# Patient Record
Sex: Male | Born: 1985 | Race: White | Hispanic: No | Marital: Single | Smoking: Current every day smoker
Health system: Southern US, Community
[De-identification: ages and names within clinical notes are randomized; demographics above are authoritative.]

## PROBLEM LIST (undated history)

## (undated) ENCOUNTER — Emergency Department: Discharge status: Absent without leave | Payer: Self-pay

## (undated) DIAGNOSIS — K900 Celiac disease: Secondary | ICD-10-CM

---

## 2010-02-07 ENCOUNTER — Ambulatory Visit: Payer: Self-pay | Admitting: Family Medicine

## 2010-12-17 NOTE — Letter (Signed)
Summary: Out of Work  [facility]  [address]   [place], [place] [address]   Phone: [phone]  Fax: [phone]    February 07, 2010   Employee:  Marin Olp    To Whom It May Concern:   For Medical reasons, please excuse the above named employee from work for the following dates:  Start:   03 February 2010  End:   05 February 2010  If you need additional information, please feel free to contact our office.         Sincerely,    Marvis Moeller DO

## 2010-12-17 NOTE — Assessment & Plan Note (Signed)
Summary: PINK EYE   Vital Signs:  Patient Profile:   25 Years Old Male CC:      left eye pain with erythema and drainage X 3days and sore throat X 1 day Height:     69 inches Weight:      258 pounds O2 Sat:      98 % O2 treatment:    Room Air Temp:     97.0 degrees F oral Pulse rate:   100 / minute BP sitting:   140 / 90  (left arm) Cuff size:   large  Pt. in pain?   yes    Location:   left eye    Type:       heaviness  Vitals Entered By: Lajean Saver RN (February 07, 2010 8:39 AM)                   Updated Prior Medication List: No Medications Current Allergies: ! * POLLENHistory of Present Illness Chief Complaint: left eye pain with erythema and drainage X 3days and sore throat X 1 day History of Present Illness: PATIENT STATES HIS SON  WAS HERE LAST WK WITH [facility] AND NOW FOR THE PAST 3 DAYS HE HAS HAD REDNESS TO HIS LEFT EYE WITH DISCHARGE AND HIS RIGHT EYE IS STARTING TO ITCH. SOME RUNNY NOSE. DEVELOPED A SORE THROAT YESTERDAY, NO FEVER, NO COUGH. NP TX PTA.  REVIEW OF SYSTEMS Constitutional Symptoms      Denies fever, chills, night sweats, weight loss, weight gain, and fatigue.  Eyes       Complains of eye pain and eye drainage.      Denies change in vision, glasses, contact lenses, and eye surgery.      Comments: left eye Ear/Nose/Throat/Mouth       Complains of sore throat.      Denies hearing loss/aids, change in hearing, ear pain, ear discharge, dizziness, frequent runny nose, frequent nose bleeds, sinus problems, hoarseness, and tooth pain or bleeding.  Respiratory       Denies dry cough, productive cough, wheezing, shortness of breath, asthma, bronchitis, and emphysema/COPD.  Cardiovascular       Denies murmurs, chest pain, and tires easily with exhertion.    Gastrointestinal       Denies stomach pain, nausea/vomiting, diarrhea, constipation, blood in bowel movements, and indigestion. Genitourniary       Denies painful urination, kidney stones, and loss of  urinary control. Neurological       Denies paralysis, seizures, and fainting/blackouts. Musculoskeletal       Denies muscle pain, joint pain, joint stiffness, decreased range of motion, redness, swelling, muscle weakness, and gout.  Skin       Denies bruising, unusual mles/lumps or sores, and hair/skin or nail changes.  Psych       Denies mood changes, temper/anger issues, anxiety/stress, speech problems, depression, and sleep problems. Other Comments: Patients son was diagnosed with pink eye last week   Past History:  Past Medical History: Unremarkable  Past Surgical History: Denies surgical history  Family History: Mother- Family History High cholesterol Father- healthy Family History Hypertension  Social History: Occupation: [employer] Single Quit smoking 3years ago, smoked X 10 years Alcohol use-yes 2/week Drug use-no Regular exercise-no Drug Use:  no Does Patient Exercise:  no Physical Exam Head: normocephalic, atraumatic Eyes: CONJUNCTIVA INJECTED BILAT L>R. THICK DISCHARGE AND CRUSTING NOTE TO LEFT EYE. PEARLA EOMI Nasal: CONGESTED Oral/Pharynx: THROAT RED AND SWOLLEN WITHOUT EXUDATES Neck: neck supple,  trachea midline,  no masses Chest/Lungs: no rales, wheezes, or rhonchi bilateral, breath sounds equal without effort Heart: regular rate and  rhythm, no murmur Skin: no obvious rashes or lesions Assessment New Problems: PHARYNGITIS, ACUTE (ICD-462.0) CONJUNCTIVITIS (ICD-372.30)   Plan New Medications/Changes: GENTAMICIN OPTH GTTS 2 GTTS three times a day FOR 5-7 DAYS.  #1 BOTTLE x 0, 02/07/2010, Marvis Moeller DO AMOXICILLIN 500 MG CAPS (AMOXICILLIN) 1 by mouth TID  #30 x 0, 02/07/2010, Marvis Moeller DO  New Orders: New Patient Level III [99203]   Prescriptions: GENTAMICIN OPTH GTTS 2 GTTS three times a day FOR 5-7 DAYS.  #1 BOTTLE x 0   Entered and Authorized by:   Marvis Moeller DO   Signed by:   Marvis Moeller DO on 02/07/2010   Method used:    Print then Give to Patient   RxID:   [id number] AMOXICILLIN 500 MG CAPS (AMOXICILLIN) 1 by mouth TID  #30 x 0   Entered and Authorized by:   Marvis Moeller DO   Signed by:   Marvis Moeller DO on 02/07/2010   Method used:   Print then Give to Patient   RxID:   [id number]   Patient Instructions: 1)  COOL COMPRESSES. AVOID CAFFEINE AND MILK PRODUCTS. FOLLOW UP WITH YOUR PCP OR RETURN IF SYMPTOMS PERSIST OR WORSEN.

## 2019-01-25 ENCOUNTER — Other Ambulatory Visit: Payer: Self-pay

## 2019-01-25 ENCOUNTER — Emergency Department (INDEPENDENT_AMBULATORY_CARE_PROVIDER_SITE_OTHER)
Admission: EM | Admit: 2019-01-25 | Discharge: 2019-01-25 | Disposition: A | Discharge status: Home / Self Care | Payer: BLUE CROSS/BLUE SHIELD | Source: Home / Self Care | Attending: Family Medicine | Admitting: Family Medicine

## 2019-01-25 ENCOUNTER — Encounter: Payer: Self-pay | Admitting: Emergency Medicine

## 2019-01-25 DIAGNOSIS — J069 Acute upper respiratory infection, unspecified: Secondary | ICD-10-CM | POA: Diagnosis not present

## 2019-01-25 DIAGNOSIS — B9789 Other viral agents as the cause of diseases classified elsewhere: Secondary | ICD-10-CM | POA: Diagnosis not present

## 2019-01-25 HISTORY — DX: Celiac disease: K90.0

## 2019-01-25 LAB — POCT INFLUENZA A/B
Influenza A, POC: NEGATIVE
Influenza B, POC: NEGATIVE

## 2019-01-25 MED ORDER — GUAIFENESIN-CODEINE 100-10 MG/5ML PO SOLN: ORAL | 0 refills | Status: DC

## 2019-01-25 MED ORDER — DOXYCYCLINE HYCLATE 100 MG PO CAPS: 100.0000 mg | ORAL_CAPSULE | Freq: Two times a day (BID) | ORAL | 0 refills | Status: DC

## 2019-01-25 NOTE — Discharge Instructions (Addendum)
Take plain guaifenesin (1200mg  extended release tabs such as Mucinex) twice daily, with plenty of water, for cough and congestion. Get adequate rest.   May use Afrin nasal spray (or generic oxymetazoline) each morning for about 5 days and then discontinue.  Also recommend using saline nasal spray several times daily and saline nasal irrigation (AYR is a common brand).  Use Flonase nasal spray each morning after using Afrin nasal spray and saline nasal irrigation. Try warm salt water gargles for sore throat.  Stop all antihistamines for now, and other non-prescription cough/cold preparations. May take Ibuprofen 200mg , 4 tabs every 8 hours with food for body aches, headache, etc. Begin Doxycycline if not improving about one week or if persistent fever develops

## 2019-01-25 NOTE — ED Triage Notes (Signed)
Productive cough, runny nose x 3 days, exposure to flu.

## 2019-01-25 NOTE — ED Provider Notes (Signed)
[facility]    CSN: [id number] Arrival date & time: 01/25/19  0818     History   Chief Complaint Chief Complaint  Patient presents with  . Cough    HPI Roger Gregory is a 33 y.o. male.   Patient complains of three day history of typical cold-like symptoms developing over several days, including mild sore throat, sinus congestion, headache, fatigue, and dry cough.  He is concerned that he may have the flu because a co-worker tested positive for influenza B. Patient smokes.  The history is provided by the patient.    Past Medical History:  Diagnosis Date  . Celiac disease     There are no active problems to display for this patient.   History reviewed. No pertinent surgical history.     Home Medications    Prior to Admission medications   Medication Sig Start Date End Date Taking? Authorizing Provider  cholecalciferol (VITAMIN D3) 25 MCG (1000 UT) tablet Take 1,000 Units by mouth daily.   Yes [provider]  lisinopril (PRINIVIL,ZESTRIL) 10 MG tablet Take 10 mg by mouth daily.   Yes [provider]  doxycycline (VIBRAMYCIN) 100 MG capsule Take 1 capsule (100 mg total) by mouth 2 (two) times daily. Take with food (Rx void after 02/02/19) 01/25/19   Lattie Haw, MD  guaiFENesin-codeine 100-10 MG/5ML syrup Take 8mL by mouth at bedtime as needed for cough.  May repeat dose in 4 to 6 hours. 01/25/19   Lattie Haw, MD    Family History Family History  Problem Relation Age of Onset  . Hyperlipidemia Mother   . Hypertension Mother     Social History Social History   Tobacco Use  . Smoking status: Current Every Day Smoker  . Smokeless tobacco: Never Used  Substance Use Topics  . Alcohol use: Yes  . Drug use: Yes     Allergies   Patient has no known allergies.   Review of Systems Review of Systems + sore throat + cough No pleuritic pain No wheezing + nasal congestion + post-nasal drainage No sinus  pain/pressure No itchy/red eyes No earache No hemoptysis No SOB No fever/chills No nausea No vomiting No abdominal pain No diarrhea No urinary symptoms No skin rash + fatigue No myalgias + headache    Physical Exam Triage Vital Signs ED Triage Vitals  Enc Vitals Group     BP 01/25/19 0843 139/80     Pulse Rate 01/25/19 0843 86     Resp --      Temp 01/25/19 0843 98.4 F (36.9 C)     Temp Source 01/25/19 0843 Oral     SpO2 01/25/19 0843 98 %     Weight 01/25/19 0844 250 lb (113.4 kg)     Height 01/25/19 0844 5\' 8"  (1.727 m)     Head Circumference --      Peak Flow --      Pain Score 01/25/19 0844 0     Pain Loc --      Pain Edu? --      Excl. in GC? --    No data found.  Updated Vital Signs BP 139/80 (BP Location: Right Arm)   Pulse 86   Temp 98.4 F (36.9 C) (Oral)   Ht 5\' 8"  (1.727 m)   Wt 113.4 kg   SpO2 98%   BMI 38.01 kg/m   Visual Acuity Right Eye Distance:   Left Eye Distance:   Bilateral Distance:  Right Eye Near:   Left Eye Near:    Bilateral Near:     Physical Exam Nursing notes and Vital Signs reviewed. Appearance:  Patient appears stated age, and in no acute distress Eyes:  Pupils are equal, round, and reactive to light and accomodation.  Extraocular movement is intact.  Conjunctivae are not inflamed  Ears:  Canals normal.  Tympanic membranes normal.  Nose:  Congested turbinates.  No sinus tenderness.  Pharynx:  Normal Neck:  Supple.  Enlarged posterior/lateral nodes are palpated bilaterally, tender to palpation on the left.   Lungs:  Clear to auscultation.  Breath sounds are equal.  Moving air well. Heart:  Regular rate and rhythm without murmurs, rubs, or gallops.  Abdomen:  Nontender without masses or hepatosplenomegaly.  Bowel sounds are present.  No CVA or flank tenderness.  Extremities:  No edema.  Skin:  No rash present.    UC Treatments / Results  Labs (all labs ordered are listed, but only abnormal results are  displayed) Labs Reviewed  POCT INFLUENZA A/B negative    EKG None  Radiology No results found.  Procedures Procedures (including critical care time)  Medications Ordered in UC Medications - No data to display  Initial Impression / Assessment and Plan / UC Course  I have reviewed the triage vital signs and the nursing notes.  Pertinent labs & imaging results that were available during my care of the patient were reviewed by me and considered in my medical decision making (see chart for details).    There is no evidence of bacterial infection today.  Treat symptomatically for now  Rx for Robitussin AC for night time cough.  Controlled Substance Prescriptions I have consulted the [place] Controlled Substances Registry for this patient, and feel the risk/benefit ratio today is favorable for proceeding with this prescription for a controlled substance.   Followup with Family Doctor if not improved In about 10 days.   Final Clinical Impressions(s) / UC Diagnoses   Final diagnoses:  Viral URI with cough     Discharge Instructions     Take plain guaifenesin (1200mg  extended release tabs such as Mucinex) twice daily, with plenty of water, for cough and congestion. Get adequate rest.   May use Afrin nasal spray (or generic oxymetazoline) each morning for about 5 days and then discontinue.  Also recommend using saline nasal spray several times daily and saline nasal irrigation (AYR is a common brand).  Use Flonase nasal spray each morning after using Afrin nasal spray and saline nasal irrigation. Try warm salt water gargles for sore throat.  Stop all antihistamines for now, and other non-prescription cough/cold preparations. May take Ibuprofen 200mg , 4 tabs every 8 hours with food for body aches, headache, etc. Begin Doxycycline if not improving about one week or if persistent fever develops (Given a prescription to hold, with an expiration date)       ED Prescriptions    Medication  Sig Dispense Auth. Provider   guaiFENesin-codeine 100-10 MG/5ML syrup Take 18mL by mouth at bedtime as needed for cough.  May repeat dose in 4 to 6 hours. 100 mL Lattie Haw, MD   doxycycline (VIBRAMYCIN) 100 MG capsule Take 1 capsule (100 mg total) by mouth 2 (two) times daily. Take with food (Rx void after 02/02/19) 14 capsule Lattie Haw, MD         Lattie Haw, MD 01/25/19 1004

## 2020-02-06 ENCOUNTER — Emergency Department
Admission: EM | Admit: 2020-02-06 | Discharge: 2020-02-06 | Disposition: A | Discharge status: Home / Self Care | Payer: 59 | Source: Home / Self Care | Attending: Family Medicine | Admitting: Family Medicine

## 2020-02-06 ENCOUNTER — Other Ambulatory Visit: Payer: Self-pay

## 2020-02-06 DIAGNOSIS — J029 Acute pharyngitis, unspecified: Secondary | ICD-10-CM | POA: Diagnosis not present

## 2020-02-06 DIAGNOSIS — K122 Cellulitis and abscess of mouth: Secondary | ICD-10-CM

## 2020-02-06 DIAGNOSIS — J020 Streptococcal pharyngitis: Secondary | ICD-10-CM

## 2020-02-06 LAB — POCT RAPID STREP A (OFFICE): Rapid Strep A Screen: NEGATIVE

## 2020-02-06 MED ORDER — AMOXICILLIN 875 MG PO TABS: 875.0000 mg | ORAL_TABLET | Freq: Two times a day (BID) | ORAL | 0 refills | Status: DC

## 2020-02-06 MED ORDER — PREDNISONE 20 MG PO TABS: ORAL_TABLET | ORAL | 0 refills | Status: DC

## 2020-02-06 NOTE — ED Provider Notes (Signed)
[facility]    CSN: [id number] Arrival date & time: 02/06/20  0803      History   Chief Complaint Chief Complaint  Patient presents with  . Sore Throat    HPI Roger Gregory is a 34 y.o. male.   Patient complains of onset of a sore throat yesterday morning that has persisted today.  He states that his uvula was swollen yesterday but somewhat better today.  He denies difficulty swallowing.  He admits that he developed nasal congestion about 3 to 4 days ago.  He states that he has allergic rhinitis in the fall but not springtime.  He has felt mildly fatigued, but denies cough and fevers, chills, and sweats.   He denies chest tightness, shortness of breath, and changes in taste/smell.  He states that he briefly had sharp pain in his left ear.   The history is provided by the patient.    Past Medical History:  Diagnosis Date  . Celiac disease     There are no problems to display for this patient.   History reviewed. No pertinent surgical history.     Home Medications    Prior to Admission medications   Medication Sig Start Date End Date Taking? Authorizing Provider  lisinopril (PRINIVIL,ZESTRIL) 10 MG tablet Take 10 mg by mouth daily.   Yes [provider]  amoxicillin (AMOXIL) 875 MG tablet Take 1 tablet (875 mg total) by mouth 2 (two) times daily. 02/06/20   Lattie Haw, MD  cholecalciferol (VITAMIN D3) 25 MCG (1000 UT) tablet Take 1,000 Units by mouth daily.    [provider]  predniSONE (DELTASONE) 20 MG tablet Take one tab by mouth twice daily for 4 days, then one daily for 3 days. Take with food. 02/06/20   Lattie Haw, MD    Family History Family History  Problem Relation Age of Onset  . Hyperlipidemia Mother   . Hypertension Mother     Social History Social History   Tobacco Use  . Smoking status: Current Every Day Smoker    Packs/day: 0.50  . Smokeless tobacco: Never Used  Substance Use Topics  . Alcohol use:  Yes    Comment: socially  . Drug use: Yes     Allergies   Patient has no known allergies.   Review of Systems Review of Systems + sore throat No cough No pleuritic pain No wheezing + nasal congestion ? post-nasal drainage No sinus pain/pressure No itchy/red eyes ? left earache No hemoptysis No SOB No fever/chills No nausea No vomiting No abdominal pain No diarrhea No urinary symptoms No skin rash + fatigue No myalgias + headache    Physical Exam Triage Vital Signs ED Triage Vitals  Enc Vitals Group     BP 02/06/20 0816 127/84     Pulse Rate 02/06/20 0816 95     Resp 02/06/20 0816 18     Temp 02/06/20 0816 98.4 F (36.9 C)     Temp Source 02/06/20 0816 Oral     SpO2 02/06/20 0816 100 %     Weight --      Height --      Head Circumference --      Peak Flow --      Pain Score 02/06/20 0814 4     Pain Loc --      Pain Edu? --      Excl. in GC? --    No data found.  Updated Vital Signs BP 127/84 (  BP Location: Left Arm)   Pulse 95   Temp 98.4 F (36.9 C) (Oral)   Resp 18   SpO2 100%   Visual Acuity Right Eye Distance:   Left Eye Distance:   Bilateral Distance:    Right Eye Near:   Left Eye Near:    Bilateral Near:     Physical Exam Nursing notes and Vital Signs reviewed. Appearance:  Patient appears stated age, and in no acute distress Eyes:  Pupils are equal, round, and reactive to light and accomodation.  Extraocular movement is intact.  Conjunctivae are not inflamed  Ears:  Canals normal.  Right tympanic membrane normal.  Left tympanic membrane has some erythema superiorly but otherwise appears normal. Nose:  Congested turbinates.  No sinus tenderness.  Pharynx:  Erythematous, mildly swollen uvula.  No exudate or obstruction Neck:  Supple.  No adenopathy.  Lungs:  Clear to auscultation.  Breath sounds are equal.  Moving air well. Heart:  Regular rate and rhythm without murmurs, rubs, or gallops.  Abdomen:  Nontender without masses or  hepatosplenomegaly.  Bowel sounds are present.  No CVA or flank tenderness.  Extremities:  No edema.  Skin:  No rash present.   UC Treatments / Results  Labs (all labs ordered are listed, but only abnormal results are displayed) Labs Reviewed  STREP A DNA PROBE  POCT RAPID STREP A (OFFICE) NEGATIVE  Tympanometry:  Right ear tympanogram is wide; Left ear tympanogram normal  EKG   Radiology No results found.  Procedures Procedures (including critical care time)  Medications Ordered in UC Medications - No data to display  Initial Impression / Assessment and Plan / UC Course  I have reviewed the triage vital signs and the nursing notes.  Pertinent labs & imaging results that were available during my care of the patient were reviewed by me and considered in my medical decision making (see chart for details).    Strep DNA probe pending.  No evidence otitis media. Suspect early viral URI.  However, because uvula is significantly erythematous, will begin amoxicillin and prednisone.  If throat culture negative, advised patient that he may discontinue amoxicillin when he feels well. Followup with Family Doctor if not improved in about 10 days.   Final Clinical Impressions(s) / UC Diagnoses   Final diagnoses:  Uvulitis  Acute pharyngitis, unspecified etiology     Discharge Instructions     Try warm salt water gargles for sore throat.   If cold-like symptoms develop, try the following: Take plain guaifenesin (1200mg  extended release tabs such as Mucinex) twice daily, with plenty of water, for cough and congestion.  Get adequate rest.   May use Afrin nasal spray (or generic oxymetazoline) each morning for about 5 days and then discontinue.  Also recommend using saline nasal spray several times daily and saline nasal irrigation (AYR is a common brand).  Use Flonase nasal spray each morning after using Afrin nasal spray and saline nasal irrigation. May take Delsym Cough Suppressant  at bedtime for nighttime cough.  Stop all antihistamines for now, and other non-prescription cough/cold preparations.       ED Prescriptions    Medication Sig Dispense Auth. Provider   amoxicillin (AMOXIL) 875 MG tablet Take 1 tablet (875 mg total) by mouth 2 (two) times daily. 20 tablet Kandra Nicolas, MD   predniSONE (DELTASONE) 20 MG tablet Take one tab by mouth twice daily for 4 days, then one daily for 3 days. Take with food. 11 tablet Shanasia Ibrahim,  Tera Mater, MD        Lattie Haw, MD 02/06/20 [phone]

## 2020-02-06 NOTE — ED Triage Notes (Signed)
Patient presents to Urgent Care with complaints of sore throat since yesterday morning. Patient reports his uvula was swollen yesterday but better today, painful to swallow but pt denies difficulty, pt endorses nasal congestion as well.

## 2020-02-06 NOTE — Discharge Instructions (Addendum)
Try warm salt water gargles for sore throat.   If cold-like symptoms develop, try the following: Take plain guaifenesin (1200mg  extended release tabs such as Mucinex) twice daily, with plenty of water, for cough and congestion.  Get adequate rest.   May use Afrin nasal spray (or generic oxymetazoline) each morning for about 5 days and then discontinue.  Also recommend using saline nasal spray several times daily and saline nasal irrigation (AYR is a common brand).  Use Flonase nasal spray each morning after using Afrin nasal spray and saline nasal irrigation. May take Delsym Cough Suppressant at bedtime for nighttime cough.  Stop all antihistamines for now, and other non-prescription cough/cold preparations.

## 2020-02-07 LAB — STREP A DNA PROBE: Group A Strep Probe: NOT DETECTED

## 2020-02-08 ENCOUNTER — Telehealth: Payer: Self-pay | Admitting: Emergency Medicine

## 2020-10-24 ENCOUNTER — Emergency Department (HOSPITAL_BASED_OUTPATIENT_CLINIC_OR_DEPARTMENT_OTHER)
Admission: EM | Admit: 2020-10-24 | Discharge: 2020-10-24 | Disposition: A | Discharge status: Home / Self Care | Payer: No Typology Code available for payment source | Attending: Emergency Medicine | Admitting: Emergency Medicine

## 2020-10-24 ENCOUNTER — Emergency Department (HOSPITAL_BASED_OUTPATIENT_CLINIC_OR_DEPARTMENT_OTHER): Payer: No Typology Code available for payment source

## 2020-10-24 ENCOUNTER — Encounter (HOSPITAL_BASED_OUTPATIENT_CLINIC_OR_DEPARTMENT_OTHER): Payer: Self-pay | Admitting: *Deleted

## 2020-10-24 ENCOUNTER — Other Ambulatory Visit: Payer: Self-pay

## 2020-10-24 DIAGNOSIS — Z23 Encounter for immunization: Secondary | ICD-10-CM | POA: Diagnosis not present

## 2020-10-24 DIAGNOSIS — W19XXXA Unspecified fall, initial encounter: Secondary | ICD-10-CM

## 2020-10-24 DIAGNOSIS — Y92410 Unspecified street and highway as the place of occurrence of the external cause: Secondary | ICD-10-CM | POA: Diagnosis not present

## 2020-10-24 DIAGNOSIS — F17200 Nicotine dependence, unspecified, uncomplicated: Secondary | ICD-10-CM | POA: Insufficient documentation

## 2020-10-24 DIAGNOSIS — R0781 Pleurodynia: Secondary | ICD-10-CM | POA: Diagnosis not present

## 2020-10-24 DIAGNOSIS — S51011A Laceration without foreign body of right elbow, initial encounter: Secondary | ICD-10-CM | POA: Insufficient documentation

## 2020-10-24 DIAGNOSIS — S59901A Unspecified injury of right elbow, initial encounter: Secondary | ICD-10-CM | POA: Diagnosis present

## 2020-10-24 DIAGNOSIS — M25511 Pain in right shoulder: Secondary | ICD-10-CM | POA: Insufficient documentation

## 2020-10-24 DIAGNOSIS — M25521 Pain in right elbow: Secondary | ICD-10-CM

## 2020-10-24 MED ORDER — TETANUS-DIPHTH-ACELL PERTUSSIS 5-2.5-18.5 LF-MCG/0.5 IM SUSY
0.5000 mL | PREFILLED_SYRINGE | Freq: Once | INTRAMUSCULAR | Status: AC
  Administered 2020-10-24: 0.5 mL via INTRAMUSCULAR
  Filled 2020-10-24: qty 0.5

## 2020-10-24 MED ORDER — METHOCARBAMOL 500 MG PO TABS: 500.0000 mg | ORAL_TABLET | Freq: Every evening | ORAL | 0 refills | Status: DC | PRN

## 2020-10-24 MED ORDER — ACETAMINOPHEN 500 MG PO TABS
1000.0000 mg | ORAL_TABLET | Freq: Once | ORAL | Status: AC
  Administered 2020-10-24: 1000 mg via ORAL
  Filled 2020-10-24: qty 2

## 2020-10-24 NOTE — ED Provider Notes (Signed)
[facility] EMERGENCY DEPARTMENT Provider Note   CSN: [id number] Arrival date & time: 10/24/20  1428     History Chief Complaint  Patient presents with  . Fall    Roger Gregory is a 34 y.o. male history of hypertension lipidemia.  Patient presents after suffering a fall.  Patient reports that he fell at 1345, states he fell out of a tractor trailer and estimates the fall at approximately 5 feet.  Patient reports that he did not hit his head had no loss of consciousness, and was ambulatory after the fall.  Denies any nausea, vomiting, headache, neck or back pain, lightheadedness, dizziness, visual disturbance, numbness or tingling in extremities, or bowel or bladder dysfunction..  Patient complains of pain to his right trapezius, right shoulder, right elbow, and right ribs.  Patient reports at present his pain is a 5 out of 10 on the pain scale.  Patient reports this is an improvement in his pain since he originally fell.  Patient reports increased pain with movement.  Patient denies any blood thinner use.  HPI     Past Medical History:  Diagnosis Date  . Celiac disease     There are no problems to display for this patient.   History reviewed. No pertinent surgical history.     Family History  Problem Relation Age of Onset  . Hyperlipidemia Mother   . Hypertension Mother     Social History   Tobacco Use  . Smoking status: Current Every Day Smoker    Packs/day: 0.50  . Smokeless tobacco: Never Used  Vaping Use  . Vaping Use: Never used  Substance Use Topics  . Alcohol use: Yes    Comment: socially  . Drug use: Yes    Home Medications Prior to Admission medications   Medication Sig Start Date End Date Taking? Authorizing Provider  amoxicillin (AMOXIL) 875 MG tablet Take 1 tablet (875 mg total) by mouth 2 (two) times daily. 02/06/20   Lattie Haw, MD  cholecalciferol (VITAMIN D3) 25 MCG (1000 UT) tablet Take 1,000 Units by mouth daily.    [provider]  lisinopril (PRINIVIL,ZESTRIL) 10 MG tablet Take 10 mg by mouth daily.    [provider]  methocarbamol (ROBAXIN) 500 MG tablet Take 1 tablet (500 mg total) by mouth at bedtime as needed for muscle spasms. 10/24/20   Haskel Schroeder, PA-C  predniSONE (DELTASONE) 20 MG tablet Take one tab by mouth twice daily for 4 days, then one daily for 3 days. Take with food. 02/06/20   Lattie Haw, MD    Allergies    Patient has no known allergies.  Review of Systems   Review of Systems  Physical Exam Updated Vital Signs BP 136/85 (BP Location: Right Arm)   Pulse 72   Temp 98.4 F (36.9 C) (Oral)   Resp 16   Ht 5\' 9"  (1.753 m)   Wt 117.9 kg   SpO2 99%   BMI 38.40 kg/m   Physical Exam Constitutional:      General: He is not in acute distress.    Appearance: He is obese. He is not ill-appearing, toxic-appearing or diaphoretic.  HENT:     Head: Normocephalic and atraumatic.  Eyes:     Pupils: Pupils are equal, round, and reactive to light.  Cardiovascular:     Rate and Rhythm: Normal rate and regular rhythm.     Heart sounds: Normal heart sounds.  Pulmonary:     Effort: Pulmonary  effort is normal.     Breath sounds: Normal breath sounds.  Abdominal:     Palpations: Abdomen is soft.     Tenderness: There is no abdominal tenderness.  Musculoskeletal:     Right shoulder: Tenderness present. No swelling, deformity, bony tenderness or crepitus. Normal range of motion. Normal strength.     Left shoulder: No deformity, tenderness, bony tenderness or crepitus. Normal range of motion. Normal strength.     Right elbow: Laceration (superficial ) present. No swelling or deformity. Normal range of motion. Tenderness present in medial epicondyle and lateral epicondyle.     Left elbow: Normal range of motion. No tenderness.     Cervical back: Normal range of motion and neck supple. No deformity, tenderness, bony tenderness or crepitus. Muscular tenderness (right  trapezius ) present. No spinous process tenderness. Normal range of motion.     Thoracic back: No deformity, tenderness or bony tenderness.     Lumbar back: No deformity, tenderness or bony tenderness.     Comments: No pain with passive range of motion to left upper extremity, and bilateral lower extremities.  Patient has abrasions and superficial lacerations to right elbow, hemorrhage controlled.    Skin:    General: Skin is warm and dry.  Neurological:     General: No focal deficit present.     Mental Status: He is alert.     GCS: GCS eye subscore is 4. GCS verbal subscore is 5. GCS motor subscore is 6.     Cranial Nerves: No cranial nerve deficit or facial asymmetry.     Motor: No weakness, tremor or seizure activity.  Psychiatric:        Behavior: Behavior is cooperative.     ED Results / Procedures / Treatments   Labs (all labs ordered are listed, but only abnormal results are displayed) Labs Reviewed - No data to display  EKG None  Radiology DG Ribs Unilateral W/Chest Right  Result Date: 10/24/2020 CLINICAL DATA:  Pain right anterior rib.  Status post fall EXAM: RIGHT RIBS AND CHEST - 3+ VIEW COMPARISON:  None. FINDINGS: Punctate metallic BB marker overlying the right chest at the level of the mid ribs. No fracture or other bone lesions are seen involving the ribs. There is no evidence of pneumothorax or pleural effusion. Both lungs are clear. Heart size and mediastinal contours are within normal limits. IMPRESSION: Negative. Electronically Signed   By: Tish Frederickson M.D.   On: 10/24/2020 17:25   DG Shoulder Right  Result Date: 10/24/2020 CLINICAL DATA:  Fall. EXAM: RIGHT SHOULDER - 2+ VIEW COMPARISON:  None. FINDINGS: There is no evidence of fracture or dislocation. There is no evidence of arthropathy or other focal bone abnormality. Soft tissues are unremarkable. IMPRESSION: Negative. Electronically Signed   By: Sebastian Ache M.D.   On: 10/24/2020 15:30   DG Elbow Complete  Right  Result Date: 10/24/2020 CLINICAL DATA:  Fall. EXAM: RIGHT ELBOW - COMPLETE 3+ VIEW COMPARISON:  None. FINDINGS: There is no evidence of fracture, dislocation, or joint effusion. There is no evidence of arthropathy or other focal bone abnormality. Soft tissues are unremarkable. IMPRESSION: Negative. Electronically Signed   By: Sebastian Ache M.D.   On: 10/24/2020 15:31    Procedures Procedures (including critical care time)  Medications Ordered in ED Medications  Tdap (BOOSTRIX) injection 0.5 mL (0.5 mLs Intramuscular Given 10/24/20 1629)  acetaminophen (TYLENOL) tablet 1,000 mg (1,000 mg Oral Given 10/24/20 1627)    ED Course  I  have reviewed the triage vital signs and the nursing notes.  Pertinent labs & imaging results that were available during my care of the patient were reviewed by me and considered in my medical decision making (see chart for details).    MDM Rules/Calculators/A&P                          Alert 34 year old male in no acute distress.  He does not meet Nexus criteria for CT scan.  Found to have any neurological deficits.  Is not on any blood thinners.  Patient received tetanus shot and Tylenol.  Right rib x-ray showed no fracture or other bony lesions abutting the ribs.  No evidence of pneumothorax or pleural effusion.   X-ray of right elbow showed no evidence of fracture dislocation or joint effusion.  His lacerations to right elbow were superficial not require any sutures.  X-ray of right shoulder showed no evidence of fracture or dislocation.  Vital signs remained stable.  Patient was given prescription for Robaxin, given information for OTC pain management, given strict return precautions.  Patient expressed understanding of all directions.  Final Clinical Impression(s) / ED Diagnoses Final diagnoses:  Fall, initial encounter  Acute pain of right shoulder  Rib pain on right side  Right elbow pain    Rx / DC Orders ED Discharge Orders         Ordered     methocarbamol (ROBAXIN) 500 MG tablet  At bedtime PRN        10/24/20 1738           Haskel Schroeder, PA-C 10/25/20 0030    Pollyann Savoy, MD 10/25/20 1556

## 2020-10-24 NOTE — Discharge Instructions (Addendum)
You came to the emergency department today to be evaluated for your right shoulder, right elbow, and right rib pain.  Your physical exam was reassuring and all of your x-rays showed no fractures.  I have given you a prescription for a muscle relaxer called Robaxin.  Please take this medication before sleep.   Please do not drive, operate heavy machinery, care for a small child with out another adult present, or perform any activities that may cause harm to you or someone else if you were to fall asleep or be impaired.   Please take Ibuprofen (Advil, motrin) and Tylenol (acetaminophen) to relieve your pain.  You may take up to 600 MG (3 pills) of normal strength ibuprofen every 8 hours as needed.  In between doses of ibuprofen you make take tylenol, up to 1,000 mg (two extra strength pills).  Do not take more than 3,000 mg tylenol in a 24 hour period.  Please check all medication labels as many medications such as pain and cold medications may contain tylenol.  Do not drink alcohol while taking these medications.  Do not take other NSAID'S while taking ibuprofen (such as aleve or naproxen).  Please take ibuprofen with food to decrease stomach upset.   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  Please use the contact information below to reach out to [facility] health for further help with with workers compensation issues.   Address: [address], [place], [place] [address] Phone: [phone]  Please return to the emergency department if: You have: Loss of feeling (numbness), tingling, or weakness in your arms or legs. Very bad neck pain, especially tenderness in the middle of the back of your neck. A change in your ability to control your pee or poop (stool). More pain in any area of your body. Swelling in any area of your body, especially your legs. Shortness of breath or light-headedness. Chest pain. Blood in your pee, poop, or vomit. Very bad pain in your belly  (abdomen) or your back. Very bad headaches or headaches that are getting worse. Sudden vision loss or double vision. Your eye suddenly turns red. The black center of your eye (pupil) is an odd shape or size. Your symptoms worsen

## 2020-10-24 NOTE — ED Triage Notes (Signed)
C/o fall x 45 mins ago with right shoulder and elbow injury.

## 2020-11-05 ENCOUNTER — Emergency Department
Admission: EM | Admit: 2020-11-05 | Discharge: 2020-11-05 | Disposition: A | Discharge status: Home / Self Care | Payer: 59 | Source: Home / Self Care

## 2020-11-05 ENCOUNTER — Telehealth: Payer: Self-pay | Admitting: Emergency Medicine

## 2020-11-05 ENCOUNTER — Encounter: Payer: Self-pay | Admitting: Emergency Medicine

## 2020-11-05 ENCOUNTER — Other Ambulatory Visit: Payer: Self-pay

## 2020-11-05 DIAGNOSIS — J020 Streptococcal pharyngitis: Secondary | ICD-10-CM | POA: Diagnosis not present

## 2020-11-05 LAB — POCT RAPID STREP A (OFFICE): Rapid Strep A Screen: POSITIVE — AB

## 2020-11-05 MED ORDER — IBUPROFEN 800 MG PO TABS
800.0000 mg | ORAL_TABLET | Freq: Once | ORAL | Status: AC
  Administered 2020-11-05: 800 mg via ORAL

## 2020-11-05 MED ORDER — AMOXICILLIN 500 MG PO CAPS: 500.0000 mg | ORAL_CAPSULE | Freq: Two times a day (BID) | ORAL | 0 refills | Status: AC

## 2020-11-05 NOTE — Telephone Encounter (Signed)
Follow up call to see if Roger Gregory had started his antibiotic- provider had called earlier, no answer. Pt stated he was asleep earlier- took 1st dose of antibiotic at 1530. Per provider Waylan Rocher, PA-C), [name] should take 2nd dose of antibiotic tonight around 11pm. Pt verbalized an understanding. Stated he felt better, will go to ER if his condition worsens

## 2020-11-05 NOTE — Discharge Instructions (Signed)

## 2020-11-05 NOTE — ED Triage Notes (Signed)
Body aches , fever, fatigue, headache, cough started today Vaccinated

## 2020-11-05 NOTE — ED Provider Notes (Signed)
[facility]    CSN: [id number] Arrival date & time: 11/05/20  1317      History   Chief Complaint Chief Complaint  Patient presents with  . Generalized Body Aches    HPI Roger Gregory is a 34 y.o. male.   HPI  Roger Gregory is a 34 y.o. male presenting to UC with c/o body aches, fever of 101*F this morning, fatigue, generalized HA, mild sore throat and dry cough since this morning.  No medication taken PTA. Denies n/v/d. He is vaccinated for COVID including booster and flu. States a family member tested positive for COVID recently but he has not been around them. He lives with his girlfriend who has not been sick but does work at a school.  HR elevated in triage. Pt denies chest pain, palpitations or SOB.  States he gets anxious at the doctor's office.   Past Medical History:  Diagnosis Date  . Celiac disease     There are no problems to display for this patient.   History reviewed. No pertinent surgical history.     Home Medications    Prior to Admission medications   Medication Sig Start Date End Date Taking? Authorizing Provider  amoxicillin (AMOXIL) 500 MG capsule Take 1 capsule (500 mg total) by mouth 2 (two) times daily for 10 days. 11/05/20 11/15/20  Lurene Shadow, PA-C  cholecalciferol (VITAMIN D3) 25 MCG (1000 UT) tablet Take 1,000 Units by mouth daily.    [provider]  lisinopril (PRINIVIL,ZESTRIL) 10 MG tablet Take 10 mg by mouth daily.    [provider]    Family History Family History  Problem Relation Age of Onset  . Hyperlipidemia Mother   . Hypertension Mother     Social History Social History   Tobacco Use  . Smoking status: Current Every Day Smoker    Packs/day: 0.50  . Smokeless tobacco: Never Used  Vaping Use  . Vaping Use: Never used  Substance Use Topics  . Alcohol use: Yes    Comment: socially  . Drug use: Yes     Allergies   Patient has no known allergies.   Review of  Systems Review of Systems  Constitutional: Positive for chills, fatigue and fever.  HENT: Positive for congestion. Negative for ear pain, sore throat, trouble swallowing and voice change.   Respiratory: Positive for cough. Negative for shortness of breath.   Cardiovascular: Negative for chest pain and palpitations.  Gastrointestinal: Negative for abdominal pain, diarrhea, nausea and vomiting.  Musculoskeletal: Positive for arthralgias, back pain and myalgias.  Skin: Negative for rash.  Neurological: Positive for headaches. Negative for dizziness.  All other systems reviewed and are negative.    Physical Exam Triage Vital Signs ED Triage Vitals  Enc Vitals Group     BP 11/05/20 1335 135/88     Pulse Rate 11/05/20 1335 (!) 127     Resp 11/05/20 1335 20     Temp 11/05/20 1335 99.4 F (37.4 C)     Temp Source 11/05/20 1335 Oral     SpO2 11/05/20 1335 97 %     Weight 11/05/20 1337 265 lb (120.2 kg)     Height 11/05/20 1337 5\' 8"  (1.727 m)     Head Circumference --      Peak Flow --      Pain Score 11/05/20 1336 6     Pain Loc --      Pain Edu? --      Excl. in  GC? --    No data found.  Updated Vital Signs BP 135/88 (BP Location: Right Arm)   Pulse (!) 120   Temp 99.4 F (37.4 C) (Oral)   Resp 20   Ht 5\' 8"  (1.727 m)   Wt 265 lb (120.2 kg)   SpO2 97%   BMI 40.29 kg/m   Visual Acuity Right Eye Distance:   Left Eye Distance:   Bilateral Distance:    Right Eye Near:   Left Eye Near:    Bilateral Near:     Physical Exam Vitals and nursing note reviewed.  Constitutional:      General: He is not in acute distress.    Appearance: Normal appearance. He is well-developed and well-nourished. He is obese. He is not ill-appearing, toxic-appearing or diaphoretic.  HENT:     Head: Normocephalic and atraumatic.     Right Ear: Tympanic membrane and ear canal normal.     Left Ear: Tympanic membrane and ear canal normal.     Nose: Nose normal.     Right Sinus: No maxillary  sinus tenderness or frontal sinus tenderness.     Left Sinus: No maxillary sinus tenderness or frontal sinus tenderness.     Mouth/Throat:     Lips: Pink.     Mouth: Mucous membranes are moist.     Pharynx: Oropharynx is clear. Uvula midline. Posterior oropharyngeal erythema and uvula swelling present. No pharyngeal swelling or oropharyngeal exudate.     Tonsils: Tonsillar exudate present. No tonsillar abscesses. 3+ on the right. 3+ on the left.  Eyes:     Extraocular Movements: EOM normal.  Cardiovascular:     Rate and Rhythm: Regular rhythm. Tachycardia present.  Pulmonary:     Effort: Pulmonary effort is normal. No respiratory distress.     Breath sounds: Normal breath sounds. No stridor. No wheezing, rhonchi or rales.  Musculoskeletal:        General: Normal range of motion.     Cervical back: Normal range of motion and neck supple. No tenderness.  Lymphadenopathy:     Cervical: Cervical adenopathy present.  Skin:    General: Skin is warm and dry.  Neurological:     Mental Status: He is alert and oriented to person, place, and time.  Psychiatric:        Mood and Affect: Mood and affect normal.        Behavior: Behavior normal.      UC Treatments / Results  Labs (all labs ordered are listed, but only abnormal results are displayed) Labs Reviewed  POCT RAPID STREP A (OFFICE) - Abnormal; Notable for the following components:      Result Value   Rapid Strep A Screen Positive (*)    All other components within normal limits    EKG   Radiology No results found.  Procedures Procedures (including critical care time)  Medications Ordered in UC Medications  ibuprofen (ADVIL) tablet 800 mg (800 mg Oral Given 11/05/20 1407)    Initial Impression / Assessment and Plan / UC Course  I have reviewed the triage vital signs and the nursing notes.  Pertinent labs & imaging results that were available during my care of the patient were reviewed by me and considered in my  medical decision making (see chart for details).     Throat exam c/w strep Rapid strep: POSITIVE Lungs: CTAB Pt tachycardic- denies chest pain, palpitations or SOB He reports fever of 101*F early this morning Ibuprofen given in UC, HR  improved from 127 to 120, encouraged good hydration, rest. Avoid sudafed and DM medication Discussed symptoms that warrant emergent care in the ED. AVS given   Final Clinical Impressions(s) / UC Diagnoses   Final diagnoses:  Strep pharyngitis     Discharge Instructions      Please take antibiotics as prescribed and be sure to complete entire course even if you start to feel better to ensure infection does not come back.  You may take 500mg  acetaminophen every 4-6 hours or in combination with ibuprofen 400-600mg  every 6-8 hours as needed for pain, inflammation, and fever.  Be sure to well hydrated with clear liquids and get at least 8 hours of sleep at night, preferably more while sick.   Please follow up with family medicine in 1 week if needed.     ED Prescriptions    Medication Sig Dispense Auth. Provider   amoxicillin (AMOXIL) 500 MG capsule Take 1 capsule (500 mg total) by mouth 2 (two) times daily for 10 days. 20 capsule , PA-C     PDMP not reviewed this encounter.   Lurene Shadow, Lurene Shadow 11/05/20 1442

## 2020-11-05 NOTE — Telephone Encounter (Signed)
Called to check on pt, left voicemail. Encouraged to call back.

## 2022-06-03 IMAGING — DX DG ELBOW COMPLETE 3+V*R*
4 series · 4 of 4 positions shown · non-contrast
Comparison: None.

CLINICAL DATA: Fall.

EXAM:
RIGHT ELBOW - COMPLETE 3+ VIEW

[elbow ap]
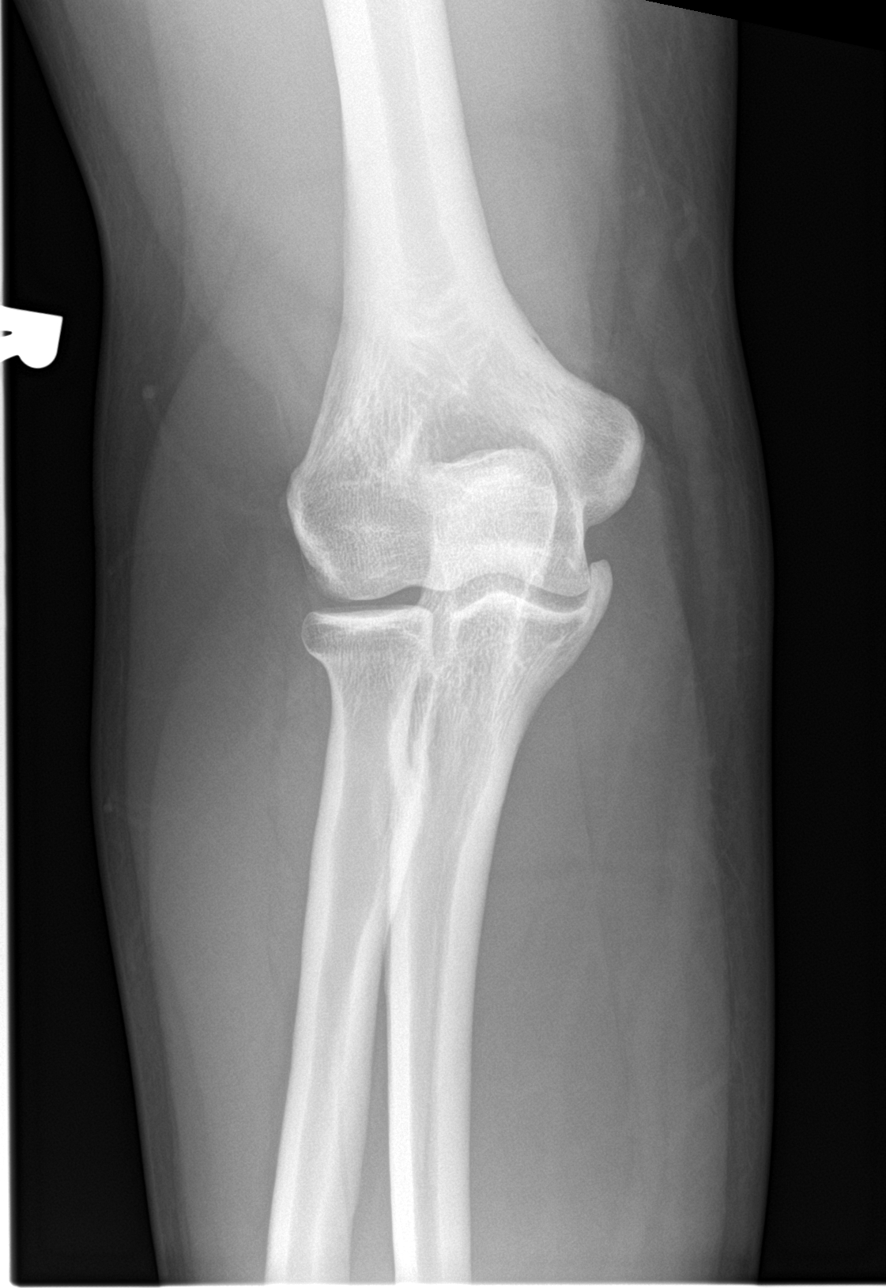

[elbow obl (1 of 2)]
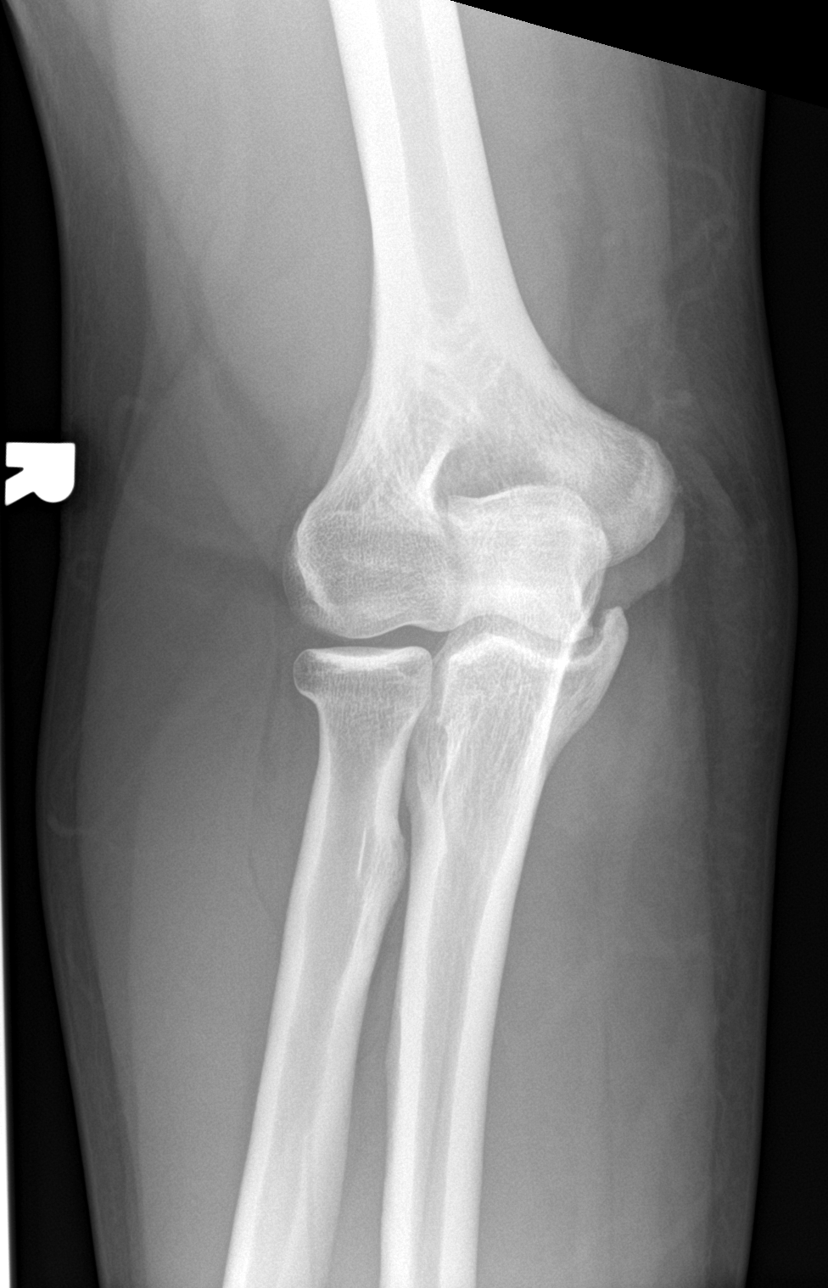

[elbow obl (2 of 2)]
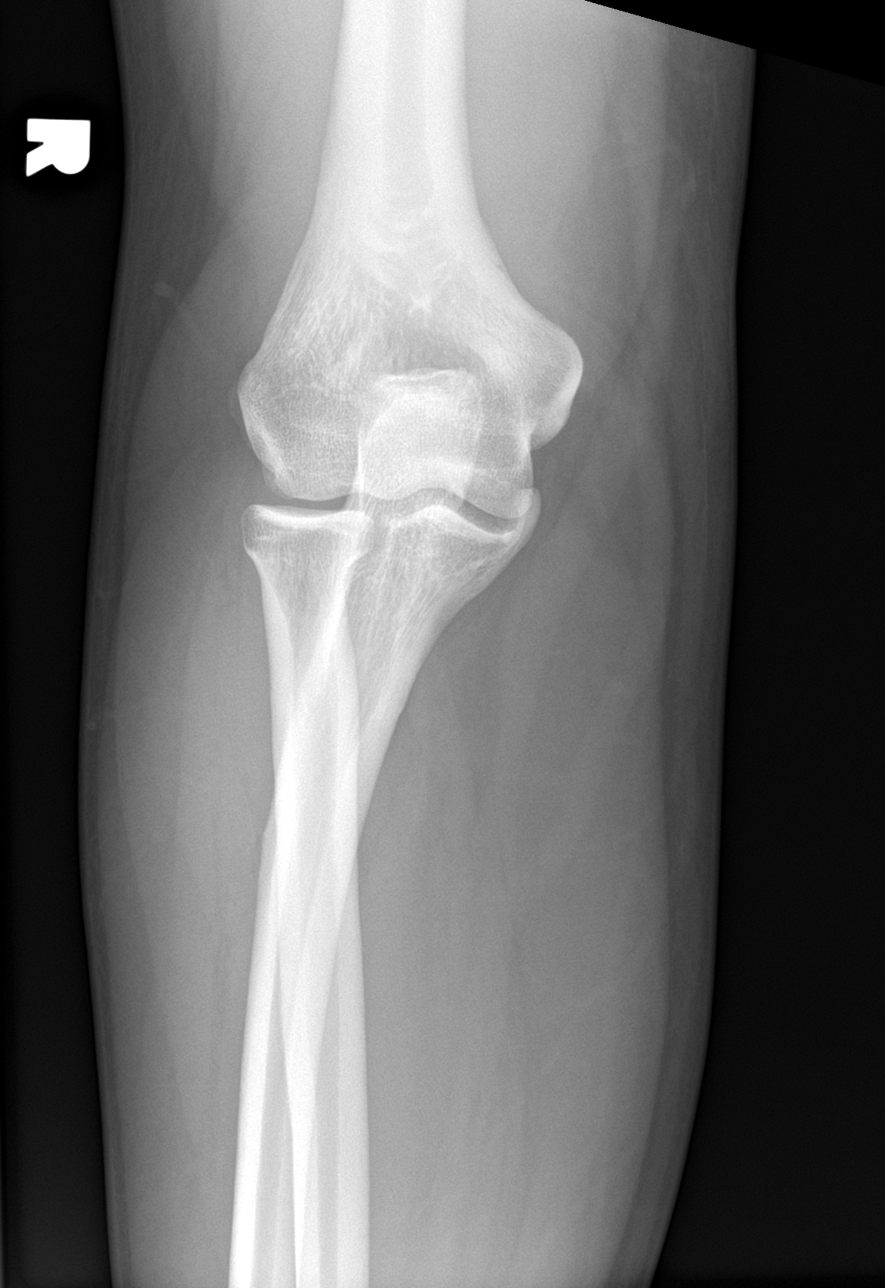

[elbow lat]
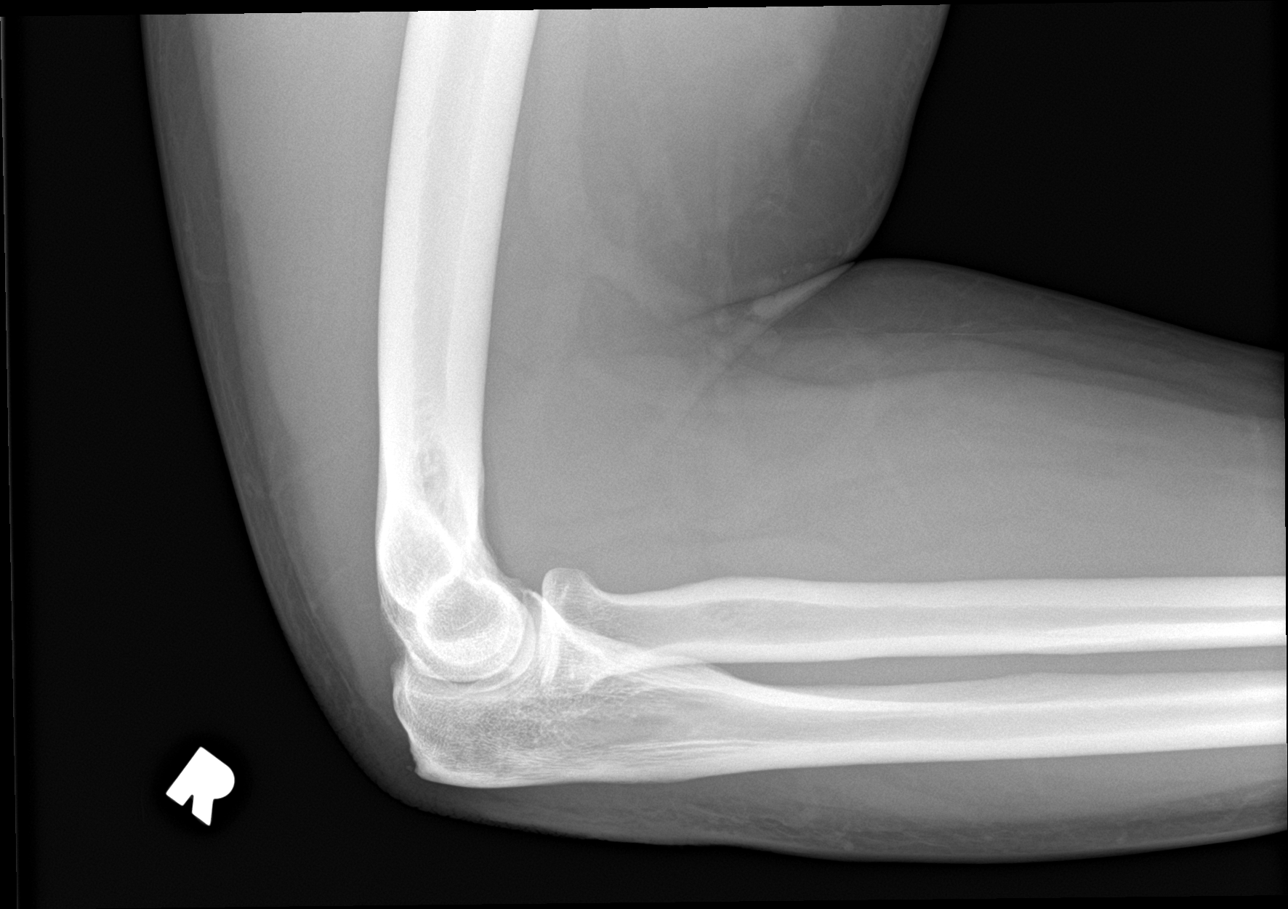

[4 of 4 positions shown; findings below may reference images not displayed]

FINDINGS: There is no evidence of fracture, dislocation, or joint effusion.
There is no evidence of arthropathy or other focal bone abnormality.
Soft tissues are unremarkable.
IMPRESSION: Negative.
